# Patient Record
Sex: Female | Born: 1960 | Race: White | Hispanic: No | Marital: Married | Smoking: Former smoker
Health system: Southern US, Community
[De-identification: ages and names within clinical notes are randomized; demographics above are authoritative.]

## PROBLEM LIST (undated history)

## (undated) DIAGNOSIS — I10 Essential (primary) hypertension: Secondary | ICD-10-CM

## (undated) DIAGNOSIS — E079 Disorder of thyroid, unspecified: Secondary | ICD-10-CM

## (undated) DIAGNOSIS — E781 Pure hyperglyceridemia: Secondary | ICD-10-CM

## (undated) DIAGNOSIS — F3281 Premenstrual dysphoric disorder: Secondary | ICD-10-CM

## (undated) HISTORY — DX: Disorder of thyroid, unspecified: E07.9

## (undated) HISTORY — DX: Pure hyperglyceridemia: E78.1

## (undated) HISTORY — DX: Essential (primary) hypertension: I10

## (undated) HISTORY — DX: Premenstrual dysphoric disorder: F32.81

## (undated) HISTORY — PX: TONSILLECTOMY AND ADENOIDECTOMY: SUR1326

---

## 2005-05-11 ENCOUNTER — Ambulatory Visit: Payer: Self-pay | Admitting: Obstetrics and Gynecology

## 2008-12-09 ENCOUNTER — Ambulatory Visit: Payer: Self-pay | Admitting: Internal Medicine

## 2009-09-19 ENCOUNTER — Ambulatory Visit: Payer: Self-pay | Admitting: Nurse Practitioner

## 2010-09-22 ENCOUNTER — Ambulatory Visit: Payer: Self-pay

## 2012-02-25 ENCOUNTER — Ambulatory Visit: Payer: Self-pay | Admitting: Unknown Physician Specialty

## 2012-12-01 ENCOUNTER — Ambulatory Visit: Payer: Self-pay | Admitting: Family Medicine

## 2013-11-17 LAB — HM PAP SMEAR: HM Pap smear: NEGATIVE

## 2015-03-05 ENCOUNTER — Ambulatory Visit (INDEPENDENT_AMBULATORY_CARE_PROVIDER_SITE_OTHER): Payer: BLUE CROSS/BLUE SHIELD | Admitting: Family Medicine

## 2015-03-05 ENCOUNTER — Other Ambulatory Visit: Payer: Self-pay

## 2015-03-05 ENCOUNTER — Encounter: Payer: Self-pay | Admitting: Family Medicine

## 2015-03-05 VITALS — BP 123/76 | HR 88 | Temp 98.9°F | Ht 66.1 in | Wt 216.0 lb

## 2015-03-05 DIAGNOSIS — F3281 Premenstrual dysphoric disorder: Secondary | ICD-10-CM | POA: Diagnosis not present

## 2015-03-05 DIAGNOSIS — E079 Disorder of thyroid, unspecified: Secondary | ICD-10-CM | POA: Diagnosis not present

## 2015-03-05 DIAGNOSIS — I10 Essential (primary) hypertension: Secondary | ICD-10-CM | POA: Diagnosis not present

## 2015-03-05 DIAGNOSIS — E781 Pure hyperglyceridemia: Secondary | ICD-10-CM

## 2015-03-05 NOTE — Assessment & Plan Note (Signed)
Under good control today. Continue current regimen. Continue to monitor.  

## 2015-03-05 NOTE — Progress Notes (Signed)
BP 123/76 mmHg  Pulse 88  Temp(Src) 98.9 F (37.2 C)  Ht 5' 6.1" (1.679 m)  Wt 216 lb (97.977 kg)  BMI 34.76 kg/m2  SpO2 98%  LMP 02/12/2015 (Approximate)   Subjective:    Patient ID: Anna Gallagher, female    DOB: 1960-08-22, 55 y.o.   MRN: [id number]  HPI: Anna Gallagher is a 55 y.o. female  Chief Complaint  Patient presents with  . Establish Care   HYPOTHYROIDISM Thyroid control status:controlled Satisfied with current treatment? yes Medication side effects: no Medication compliance: excellent compliance Recent dose adjustment:no Fatigue: yes Cold intolerance: no Heat intolerance: no Weight gain: no Weight loss: no Constipation: no Diarrhea/loose stools: no Palpitations: yes Lower extremity edema: no Anxiety/depressed mood: no   PMDD- getting better as she is getting older, doing well off medicine  HYPERTENSION / HYPERLIPIDEMIA Satisfied with current treatment? yes Duration of hypertension: chronic BP monitoring frequency: not checking BP medication side effects: no Duration of hyperlipidemia: chronic Cholesterol medication side effects: no - only on fish oil  Cholesterol supplements: fish oil Medication compliance: fair compliance Aspirin: no Recent stressors: no Recurrent headaches: no Visual changes: no Palpitations: no Dyspnea: no Chest pain: no Lower extremity edema: no Dizzy/lightheaded: no  Relevant past medical, surgical, family and social history reviewed and updated as indicated. Interim medical history since our last visit reviewed. Allergies and medications reviewed and updated.  Review of Systems  Constitutional: Negative.   Respiratory: Negative.   Cardiovascular: Negative.   Gastrointestinal: Negative.   Endocrine: Negative.   Psychiatric/Behavioral: Negative.     Per HPI unless specifically indicated above     Objective:    BP 123/76 mmHg  Pulse 88  Temp(Src) 98.9 F (37.2 C)  Ht 5' 6.1" (1.679 m)  Wt 216 lb  (97.977 kg)  BMI 34.76 kg/m2  SpO2 98%  LMP 02/12/2015 (Approximate)  Wt Readings from Last 3 Encounters:  03/05/15 216 lb (97.977 kg)    Physical Exam  Constitutional: She is oriented to person, place, and time. She appears well-developed and well-nourished. No distress.  HENT:  Head: Normocephalic and atraumatic.  Right Ear: Hearing and external ear normal.  Left Ear: Hearing and external ear normal.  Nose: Nose normal.  Mouth/Throat: Oropharynx is clear and moist. No oropharyngeal exudate.  Eyes: Conjunctivae, EOM and lids are normal. Pupils are equal, round, and reactive to light. Right eye exhibits no discharge. Left eye exhibits no discharge. No scleral icterus.  Cardiovascular: Normal rate, regular rhythm, normal heart sounds and intact distal pulses.  Exam reveals no gallop and no friction rub.   No murmur heard. Pulmonary/Chest: Effort normal and breath sounds normal. No respiratory distress. She has no wheezes. She has no rales. She exhibits no tenderness.  Musculoskeletal: Normal range of motion.  Neurological: She is alert and oriented to person, place, and time.  Skin: Skin is warm, dry and intact. No rash noted. No erythema. No pallor.  Psychiatric: She has a normal mood and affect. Her speech is normal and behavior is normal. Judgment and thought content normal. Cognition and memory are normal.  Nursing note and vitals reviewed.   No results found for this or any previous visit.    Assessment & Plan:   Problem List Items Addressed This Visit      Cardiovascular and Mediastinum   Hypertension - Primary    Under good control today. Continue current regimen. Continue to monitor.       Relevant Medications   hydrochlorothiazide (  HYDRODIURIL) 25 MG tablet   Other Relevant Orders   Basic metabolic panel     Endocrine   Thyroid disease    Under good control today. Continue current regimen. Continue to monitor.       Relevant Medications   levothyroxine  (SYNTHROID, LEVOTHROID) 175 MCG tablet     Other   Hypertriglyceridemia    Will recheck in 6 months. Continue to work on diet and exercise. Consider fenofibrate if continues to be so high.       Relevant Medications   hydrochlorothiazide (HYDRODIURIL) 25 MG tablet   PMDD (premenstrual dysphoric disorder)    Under fail control. Continue current regimen. Continue to monitor. .          Follow up plan: Return in about 6 months (around 09/02/2015) for Cholesterol and BP.

## 2015-03-05 NOTE — Assessment & Plan Note (Signed)
Under fail control. Continue current regimen. Continue to monitor. Marland Kitchen

## 2015-03-05 NOTE — Assessment & Plan Note (Signed)
Will recheck in 6 months. Continue to work on diet and exercise. Consider fenofibrate if continues to be so high.

## 2015-03-06 ENCOUNTER — Encounter: Payer: Self-pay | Admitting: Family Medicine

## 2015-03-06 LAB — BASIC METABOLIC PANEL
BUN / CREAT RATIO: 30 — AB (ref 9–23)
BUN: 19 mg/dL (ref 6–24)
CHLORIDE: 98 mmol/L (ref 96–106)
CO2: 25 mmol/L (ref 18–29)
Calcium: 9.4 mg/dL (ref 8.7–10.2)
Creatinine, Ser: 0.64 mg/dL (ref 0.57–1.00)
GFR calc non Af Amer: 102 mL/min/{1.73_m2} (ref >59)
GFR, EST AFRICAN AMERICAN: 118 mL/min/{1.73_m2} (ref >59)
GLUCOSE: 83 mg/dL (ref 65–99)
POTASSIUM: 3.6 mmol/L (ref 3.5–5.2)
SODIUM: 138 mmol/L (ref 134–144)

## 2015-09-03 ENCOUNTER — Ambulatory Visit: Payer: BLUE CROSS/BLUE SHIELD | Admitting: Family Medicine

## 2015-12-31 ENCOUNTER — Ambulatory Visit (INDEPENDENT_AMBULATORY_CARE_PROVIDER_SITE_OTHER): Payer: BLUE CROSS/BLUE SHIELD | Admitting: Family Medicine

## 2015-12-31 ENCOUNTER — Other Ambulatory Visit: Payer: Self-pay | Admitting: Family Medicine

## 2015-12-31 ENCOUNTER — Encounter: Payer: Self-pay | Admitting: Family Medicine

## 2015-12-31 VITALS — BP 137/86 | HR 92 | Temp 98.8°F | Ht 65.4 in | Wt 215.3 lb

## 2015-12-31 DIAGNOSIS — Z0000 Encounter for general adult medical examination without abnormal findings: Secondary | ICD-10-CM

## 2015-12-31 DIAGNOSIS — E781 Pure hyperglyceridemia: Secondary | ICD-10-CM

## 2015-12-31 DIAGNOSIS — E079 Disorder of thyroid, unspecified: Secondary | ICD-10-CM

## 2015-12-31 DIAGNOSIS — F3281 Premenstrual dysphoric disorder: Secondary | ICD-10-CM | POA: Diagnosis not present

## 2015-12-31 DIAGNOSIS — I10 Essential (primary) hypertension: Secondary | ICD-10-CM | POA: Diagnosis not present

## 2015-12-31 LAB — UA/M W/RFLX CULTURE, ROUTINE
BILIRUBIN UA: NEGATIVE
GLUCOSE, UA: NEGATIVE
Ketones, UA: NEGATIVE
LEUKOCYTES UA: NEGATIVE
Nitrite, UA: NEGATIVE
PH UA: 8.5 — AB (ref 5.0–7.5)
PROTEIN UA: NEGATIVE
RBC, UA: NEGATIVE
Specific Gravity, UA: 1.015 (ref 1.005–1.030)
Urobilinogen, Ur: 1 mg/dL (ref 0.2–1.0)

## 2015-12-31 LAB — MICROALBUMIN, URINE WAIVED
Creatinine, Urine Waived: 50 mg/dL (ref 10–300)
MICROALB, UR WAIVED: 10 mg/L (ref 0–19)
Microalb/Creat Ratio: <30 mg/g (ref <30)

## 2015-12-31 MED ORDER — HYDROCHLOROTHIAZIDE 25 MG PO TABS: 25.0000 mg | ORAL_TABLET | Freq: Every day | ORAL | 3 refills | Status: DC

## 2015-12-31 NOTE — Assessment & Plan Note (Signed)
Under good control. Continue current regimen. Continue to monitor. Call with any concerns. 

## 2015-12-31 NOTE — Progress Notes (Signed)
BP 137/86 (BP Location: Left Arm, Patient Position: Sitting, Cuff Size: Normal)   Pulse 92   Temp 98.8 F (37.1 C)   Ht 5' 5.4" (1.661 m)   Wt 215 lb 4.8 oz (97.7 kg)   LMP 12/31/2015 (Exact Date)   SpO2 96%   BMI 35.39 kg/m    Subjective:    Patient ID: Anna Gallagher, female    DOB: [birth year], 55 y.o.   MRN: [id number]  HPI: Anna Sierrasheresa M Pickney is a [age] y.o. female presenting on 12/31/2015 for comprehensive medical examination. Current medical complaints include:  HYPERTENSION / HYPERLIPIDEMIA Satisfied with current treatment? yes Duration of hypertension: chronic BP monitoring frequency: not checking BP medication side effects: no Past BP meds: HCTZ Duration of hyperlipidemia: chronic Cholesterol medication side effects: Not on anything Cholesterol supplements: fish oil Past cholesterol medications: none Medication compliance: excellent compliance Aspirin: no Recent stressors: yes Recurrent headaches: no Visual changes: no Palpitations: no Dyspnea: no Chest pain: no Lower extremity edema: no Dizzy/lightheaded: no  HYPOTHYROIDISM Thyroid control status:controlled Satisfied with current treatment? yes Medication side effects: yes Medication compliance: excellent compliance Recent dose adjustment:no Fatigue: yes Cold intolerance: no Heat intolerance: no Weight gain: no Weight loss: no Constipation: no Diarrhea/loose stools: no Palpitations: no Lower extremity edema: no Anxiety/depressed mood: yes  She currently lives with: husband and sons Menopausal Symptoms: yes  Depression Screen done today and results listed below:  [facility]  Decreased Interest 0  Down, Depressed, Hopeless 0  PHQ - 2 Score 0  Altered sleeping 0  Tired, decreased energy 1  Change in appetite 1  Feeling bad or failure about yourself  0  Trouble concentrating 0  Moving slowly or fidgety/restless 0  Suicidal thoughts 0  PHQ-9 Score 2   Past  Medical History:  Past Medical History:  Diagnosis Date  . Hypertension   . Hypertriglyceridemia   . PMDD (premenstrual dysphoric disorder)   . Thyroid disease     Surgical History:  Past Surgical History:  Procedure Laterality Date  . TONSILLECTOMY AND ADENOIDECTOMY      Medications:  Current Outpatient Prescriptions on File Prior to Visit  Medication Sig  . levothyroxine (SYNTHROID, LEVOTHROID) 175 MCG tablet Take 175 mcg by mouth daily.  . Loratadine 10 MG CAPS Take by mouth daily as needed.  . Omega-3 Fatty Acids (FISH OIL CONCENTRATE) 300 MG CAPS Take 900 mg by mouth daily.  Satira Sark. [place] Johns Wort (V-R ST JOHNS WORT) 300 MG TABS Take 375 mg by mouth daily.   No current facility-administered medications on file prior to visit.     Allergies:  No Known Allergies  Social History:  Social History   Social History  . Marital status: Married    Spouse name: N/A  . Number of children: N/A  . Years of education: N/A   Occupational History  . Not on file.   Social History Main Topics  . Smoking status: Former Smoker    Quit date: 03/04/1985  . Smokeless tobacco: Never Used  . Alcohol use No  . Drug use: No  . Sexual activity: Not on file   Other Topics Concern  . Not on file   Social History Narrative  . No narrative on file   History  Smoking Status  . Former Smoker  . Quit date: 03/04/1985  Smokeless Tobacco  . Never Used   History  Alcohol Use No    Family History:  Family History  Problem Relation Age  of Onset  . Hypertension Father   . Hyperlipidemia Father   . Diabetes Father   . Hypertension Brother     Past medical history, surgical history, medications, allergies, family history and social history reviewed with patient today and changes made to appropriate areas of the chart.   Review of Systems  Constitutional: Negative.   HENT: Negative.   Eyes: Negative.   Respiratory: Negative.   Cardiovascular: Negative.   Gastrointestinal: Negative.    Genitourinary: Negative.   Musculoskeletal: Negative.   Skin: Negative.   Neurological: Negative.   Endo/Heme/Allergies: Negative.   Psychiatric/Behavioral: Negative.     All other ROS negative except what is listed above and in the HPI.      Objective:    BP 137/86 (BP Location: Left Arm, Patient Position: Sitting, Cuff Size: Normal)   Pulse 92   Temp 98.8 F (37.1 C)   Ht 5' 5.4" (1.661 m)   Wt 215 lb 4.8 oz (97.7 kg)   LMP 12/31/2015 (Exact Date)   SpO2 96%   BMI 35.39 kg/m   Wt Readings from Last 3 Encounters:  12/31/15 215 lb 4.8 oz (97.7 kg)  03/05/15 216 lb (98 kg)    Physical Exam  Constitutional: She is oriented to person, place, and time. She appears well-developed and well-nourished. No distress.  HENT:  Head: Normocephalic and atraumatic.  Right Ear: Hearing, tympanic membrane, external ear and ear canal normal.  Left Ear: Hearing, tympanic membrane, external ear and ear canal normal.  Nose: Nose normal.  Mouth/Throat: Uvula is midline, oropharynx is clear and moist and mucous membranes are normal. No oropharyngeal exudate.  Eyes: Conjunctivae, EOM and lids are normal. Pupils are equal, round, and reactive to light. Right eye exhibits no discharge. Left eye exhibits no discharge. No scleral icterus.  Neck: Normal range of motion. Neck supple. No JVD present. No tracheal deviation present. No thyromegaly present.  Cardiovascular: Normal rate, regular rhythm, normal heart sounds and intact distal pulses.  Exam reveals no gallop and no friction rub.   No murmur heard. Pulmonary/Chest: Effort normal and breath sounds normal. No stridor. No respiratory distress. She has no wheezes. She has no rales. She exhibits no tenderness. Right breast exhibits no inverted nipple, no mass, no nipple discharge, no skin change and no tenderness. Left breast exhibits no inverted nipple, no mass, no nipple discharge, no skin change and no tenderness. Breasts are symmetrical.    Abdominal: Soft. Bowel sounds are normal. She exhibits no distension and no mass. There is no tenderness. There is no rebound and no guarding.  Genitourinary:  Genitourinary Comments: GYN exam deferred with shared decision making  Musculoskeletal: Normal range of motion. She exhibits no edema, tenderness or deformity.  Lymphadenopathy:    She has no cervical adenopathy.  Neurological: She is alert and oriented to person, place, and time. She has normal reflexes. She displays normal reflexes. No cranial nerve deficit. She exhibits normal muscle tone. Coordination normal.  Skin: Skin is warm, dry and intact. No rash noted. She is not diaphoretic. No erythema. No pallor.  Psychiatric: She has a normal mood and affect. Her speech is normal and behavior is normal. Judgment and thought content normal. Cognition and memory are normal.  Nursing note and vitals reviewed.   Results for orders placed or performed in visit on 03/05/15  Basic metabolic panel  Result Value Ref Range   Glucose 83 65 - 99 mg/dL   BUN 19 6 - 24 mg/dL   Creatinine,  Ser 0.64 0.57 - 1.00 mg/dL   GFR calc non Af Amer 102 >59 mL/min/1.73   GFR calc Af Amer 118 >59 mL/min/1.73   BUN/Creatinine Ratio 30 (H) 9 - 23   Sodium 138 134 - 144 mmol/L   Potassium 3.6 3.5 - 5.2 mmol/L   Chloride 98 96 - 106 mmol/L   CO2 25 18 - 29 mmol/L   Calcium 9.4 8.7 - 10.2 mg/dL      Assessment & Plan:   Problem List Items Addressed This Visit      Cardiovascular and Mediastinum   Hypertension    Under good control. Continue current regimen. Continue to monitor. Call with any concerns.       Relevant Medications   hydrochlorothiazide (HYDRODIURIL) 25 MG tablet     Endocrine   Thyroid disease    Rechecking levels today. Will adjust dose as needed. Await results.         Other   Hypertriglyceridemia    Rechecking levels today. Await results. Call with any concerns.       Relevant Medications   hydrochlorothiazide  (HYDRODIURIL) 25 MG tablet   PMDD (premenstrual dysphoric disorder)    Stable. Bothers her a lot before period, but stable. Call with any concerns.        Other Visit Diagnoses    Routine general medical examination at a health care facility    -  Primary   Vaccines up to date. Screening labs checked today. Pap up to date. Mammogram ordered. Colonoscopy up to date. Continue diet and exercise. Recheck 1 year.       Follow up plan: Return in about 6 months (around 06/29/2016) for Follow up BP/Chol.   LABORATORY TESTING:  - Pap smear: up to date  IMMUNIZATIONS:   - Tdap: Tetanus vaccination status reviewed: last tetanus booster within 10 years. - Influenza: Refused - Pneumovax: Not applicable - Prevnar: Not applicable - Zostavax vaccine: Not applicable  SCREENING: -Mammogram: Up to date- would like to wait until next year for next mammogram  - Colonoscopy: Up to date  - Bone Density: Not applicable   PATIENT COUNSELING:   Advised to take 1 mg of folate supplement per day if capable of pregnancy.   Sexuality: Discussed sexually transmitted diseases, partner selection, use of condoms, avoidance of unintended pregnancy  and contraceptive alternatives.   Advised to avoid cigarette smoking.  I discussed with the patient that most people either abstain from alcohol or drink within safe limits (<=14/week and <=4 drinks/occasion for males, <=7/weeks and <= 3 drinks/occasion for females) and that the risk for alcohol disorders and other health effects rises proportionally with the number of drinks per week and how often a drinker exceeds daily limits.  Discussed cessation/primary prevention of drug use and availability of treatment for abuse.   Diet: Encouraged to adjust caloric intake to maintain  or achieve ideal body weight, to reduce intake of dietary saturated fat and total fat, to limit sodium intake by avoiding high sodium foods and not adding table salt, and to maintain adequate  dietary potassium and calcium preferably from fresh fruits, vegetables, and low-fat dairy products.    stressed the importance of regular exercise  Injury prevention: Discussed safety belts, safety helmets, smoke detector, smoking near bedding or upholstery.   Dental health: Discussed importance of regular tooth brushing, flossing, and dental visits.    NEXT PREVENTATIVE PHYSICAL DUE IN 1 YEAR. Return in about 6 months (around 06/29/2016) for Follow up BP/Chol.

## 2015-12-31 NOTE — Patient Instructions (Addendum)

## 2015-12-31 NOTE — Assessment & Plan Note (Signed)
Rechecking levels today. Will adjust dose as needed. Await results.  

## 2015-12-31 NOTE — Assessment & Plan Note (Addendum)
Stable. Bothers her a lot before period, but stable. Call with any concerns.

## 2015-12-31 NOTE — Assessment & Plan Note (Signed)
Rechecking levels today. Await results. Call with any concerns.  

## 2016-01-01 LAB — COMPREHENSIVE METABOLIC PANEL
ALT: 16 IU/L (ref 0–32)
AST: 16 IU/L (ref 0–40)
Albumin/Globulin Ratio: 1.7 (ref 1.2–2.2)
Albumin: 4 g/dL (ref 3.5–5.5)
Alkaline Phosphatase: 65 IU/L (ref 39–117)
BUN/Creatinine Ratio: 13 (ref 9–23)
BUN: 11 mg/dL (ref 6–24)
Bilirubin Total: 0.4 mg/dL (ref 0.0–1.2)
CALCIUM: 9.3 mg/dL (ref 8.7–10.2)
CO2: 26 mmol/L (ref 18–29)
CREATININE: 0.86 mg/dL (ref 0.57–1.00)
Chloride: 100 mmol/L (ref 96–106)
GFR calc Af Amer: 89 mL/min/{1.73_m2} (ref >59)
GFR, EST NON AFRICAN AMERICAN: 77 mL/min/{1.73_m2} (ref >59)
GLUCOSE: 84 mg/dL (ref 65–99)
Globulin, Total: 2.4 g/dL (ref 1.5–4.5)
POTASSIUM: 3.8 mmol/L (ref 3.5–5.2)
Sodium: 142 mmol/L (ref 134–144)
Total Protein: 6.4 g/dL (ref 6.0–8.5)

## 2016-01-01 LAB — LIPID PANEL W/O CHOL/HDL RATIO
Cholesterol, Total: 185 mg/dL (ref 100–199)
HDL: 45 mg/dL (ref >39)
LDL CALC: 98 mg/dL (ref 0–99)
TRIGLYCERIDES: 211 mg/dL — AB (ref 0–149)
VLDL CHOLESTEROL CAL: 42 mg/dL — AB (ref 5–40)

## 2016-01-01 LAB — TSH: TSH: 0.146 u[IU]/mL — ABNORMAL LOW (ref 0.450–4.500)

## 2016-01-01 LAB — CBC WITH DIFFERENTIAL/PLATELET
Basophils Absolute: 0 10*3/uL (ref 0.0–0.2)
Basos: 0 %
EOS (ABSOLUTE): 0.1 10*3/uL (ref 0.0–0.4)
Eos: 2 %
Hematocrit: 37.5 % (ref 34.0–46.6)
Hemoglobin: 12.6 g/dL (ref 11.1–15.9)
IMMATURE GRANS (ABS): 0 10*3/uL (ref 0.0–0.1)
IMMATURE GRANULOCYTES: 0 %
LYMPHS: 28 %
Lymphocytes Absolute: 1.7 10*3/uL (ref 0.7–3.1)
MCH: 28.5 pg (ref 26.6–33.0)
MCHC: 33.6 g/dL (ref 31.5–35.7)
MCV: 85 fL (ref 79–97)
MONOS ABS: 0.5 10*3/uL (ref 0.1–0.9)
Monocytes: 8 %
NEUTROS PCT: 62 %
Neutrophils Absolute: 3.6 10*3/uL (ref 1.4–7.0)
PLATELETS: 301 10*3/uL (ref 150–379)
RBC: 4.42 x10E6/uL (ref 3.77–5.28)
RDW: 15.2 % (ref 12.3–15.4)
WBC: 5.8 10*3/uL (ref 3.4–10.8)

## 2016-01-03 ENCOUNTER — Telehealth: Payer: Self-pay | Admitting: Family Medicine

## 2016-01-03 DIAGNOSIS — E079 Disorder of thyroid, unspecified: Secondary | ICD-10-CM

## 2016-01-03 MED ORDER — LEVOTHYROXINE SODIUM 150 MCG PO TABS: 150.0000 ug | ORAL_TABLET | Freq: Every day | ORAL | 1 refills | Status: DC

## 2016-01-03 NOTE — Telephone Encounter (Signed)
Su Monksalled [place] Thyroid over treated. The rest of her labs look great. I'm going to decrease her to 150mcg and I'd like her to come back in just for a lab check in 6 weeks. OK to give her this message if she calls back. Mychart message sent to her as well

## 2016-01-06 NOTE — Telephone Encounter (Signed)
Called patient. She was aware.

## 2016-02-25 ENCOUNTER — Other Ambulatory Visit: Payer: BLUE CROSS/BLUE SHIELD

## 2016-02-25 DIAGNOSIS — E079 Disorder of thyroid, unspecified: Secondary | ICD-10-CM

## 2016-02-26 LAB — TSH: TSH: 0.93 u[IU]/mL (ref 0.450–4.500)

## 2016-02-28 ENCOUNTER — Other Ambulatory Visit: Payer: Self-pay | Admitting: Family Medicine

## 2016-02-28 MED ORDER — LEVOTHYROXINE SODIUM 150 MCG PO TABS: 150.0000 ug | ORAL_TABLET | Freq: Every day | ORAL | 11 refills | Status: DC

## 2016-04-07 ENCOUNTER — Other Ambulatory Visit: Payer: Self-pay | Admitting: Family Medicine

## 2016-04-07 DIAGNOSIS — Z1231 Encounter for screening mammogram for malignant neoplasm of breast: Secondary | ICD-10-CM

## 2016-04-20 ENCOUNTER — Ambulatory Visit: Admission: RE | Admit: 2016-04-20 | Payer: Self-pay | Source: Ambulatory Visit

## 2016-04-27 ENCOUNTER — Ambulatory Visit
Admission: RE | Admit: 2016-04-27 | Discharge: 2016-04-27 | Disposition: A | Discharge status: Home / Self Care | Payer: BLUE CROSS/BLUE SHIELD | Source: Ambulatory Visit | Attending: Family Medicine | Admitting: Family Medicine

## 2016-04-27 ENCOUNTER — Other Ambulatory Visit: Payer: Self-pay | Admitting: Family Medicine

## 2016-04-27 DIAGNOSIS — Z1231 Encounter for screening mammogram for malignant neoplasm of breast: Secondary | ICD-10-CM | POA: Diagnosis not present

## 2016-06-29 ENCOUNTER — Ambulatory Visit: Payer: BLUE CROSS/BLUE SHIELD | Admitting: Family Medicine

## 2017-01-08 LAB — BASIC METABOLIC PANEL
BUN: 16 (ref 4–21)
CREATININE: 0.8 (ref 0.5–1.1)
Glucose: 102
Potassium: 3.3 — AB (ref 3.4–5.3)
Sodium: 144 (ref 137–147)

## 2017-01-08 LAB — CBC AND DIFFERENTIAL
HCT: 42 (ref 36–46)
Hemoglobin: 14.2 (ref 12.0–16.0)
PLATELETS: 272 (ref 150–399)
WBC: 6.3

## 2017-01-08 LAB — LIPID PANEL
Cholesterol: 213 — AB (ref 0–200)
HDL: 43 (ref 35–70)
LDL Cholesterol: 128
Triglycerides: 211 — AB (ref 40–160)

## 2017-01-08 LAB — HEPATIC FUNCTION PANEL
ALK PHOS: 60 (ref 25–125)
ALT: 34 (ref 7–35)
AST: 21 (ref 13–35)

## 2017-01-08 LAB — TSH: TSH: 0.16 — AB (ref 0.41–5.90)

## 2017-01-14 ENCOUNTER — Other Ambulatory Visit: Payer: Self-pay | Admitting: Family Medicine

## 2017-01-21 ENCOUNTER — Ambulatory Visit (INDEPENDENT_AMBULATORY_CARE_PROVIDER_SITE_OTHER): Payer: Self-pay | Admitting: Family Medicine

## 2017-01-21 ENCOUNTER — Encounter: Payer: Self-pay | Admitting: Family Medicine

## 2017-01-21 VITALS — BP 118/74 | HR 91 | Temp 99.1°F | Ht 65.5 in | Wt 189.2 lb

## 2017-01-21 DIAGNOSIS — Z0000 Encounter for general adult medical examination without abnormal findings: Secondary | ICD-10-CM

## 2017-01-21 DIAGNOSIS — E079 Disorder of thyroid, unspecified: Secondary | ICD-10-CM

## 2017-01-21 DIAGNOSIS — I10 Essential (primary) hypertension: Secondary | ICD-10-CM

## 2017-01-21 DIAGNOSIS — E781 Pure hyperglyceridemia: Secondary | ICD-10-CM

## 2017-01-21 DIAGNOSIS — Z1239 Encounter for other screening for malignant neoplasm of breast: Secondary | ICD-10-CM

## 2017-01-21 MED ORDER — LEVOTHYROXINE SODIUM 125 MCG PO TABS: 125.0000 ug | ORAL_TABLET | Freq: Every day | ORAL | 3 refills | Status: DC

## 2017-01-21 MED ORDER — HYDROCHLOROTHIAZIDE 25 MG PO TABS: 25.0000 mg | ORAL_TABLET | Freq: Every day | ORAL | 3 refills | Status: DC

## 2017-01-21 NOTE — Progress Notes (Signed)
BP 118/74 (BP Location: Left Arm, Cuff Size: Normal)   Pulse 91   Temp 99.1 F (37.3 C)   Ht 5' 5.5" (1.664 m)   Wt 189 lb 4 oz (85.8 kg)   SpO2 96%   BMI 31.01 kg/m    Subjective:    Patient ID: Anna Gallagher, female    DOB: Dec 25, 1960, 56 y.o.   MRN: [id number]  HPI: Anna Gallagher is a 56 y.o. female presenting on 01/21/2017 for comprehensive medical examination. Current medical complaints include:  HYPERTENSION Hypertension status: controlled  Satisfied with current treatment? yes Duration of hypertension: chronic BP monitoring frequency:  not checking BP medication side effects:  no Medication compliance: excellent compliance Previous BP meds: hctz Aspirin: no Recurrent headaches: no Visual changes: no Palpitations: no Dyspnea: no Chest pain: no Lower extremity edema: no Dizzy/lightheaded: no  HYPOTHYROIDISM Thyroid control status:uncontrolled Satisfied with current treatment? no Medication side effects: no Medication compliance: excellent compliance Recent dose adjustment:no Fatigue: no Cold intolerance: no Heat intolerance: no Weight gain: no Weight loss: no Constipation: no Diarrhea/loose stools: no Palpitations: no Lower extremity edema: no Anxiety/depressed mood: no  She currently lives with: husband and sons Menopausal Symptoms: no  Depression Screen done today and results listed below:  Depression screen [facility] 2/9 01/21/2017 12/31/2015  Decreased Interest 0 0  Down, Depressed, Hopeless 0 0  PHQ - 2 Score 0 0  Altered sleeping 0 0  Tired, decreased energy 0 1  Change in appetite 0 1  Feeling bad or failure about yourself  0 0  Trouble concentrating 0 0  Moving slowly or fidgety/restless 0 0  Suicidal thoughts 0 0  PHQ-9 Score 0 2    Past Medical History:  Past Medical History:  Diagnosis Date  . Hypertension   . Hypertriglyceridemia   . PMDD (premenstrual dysphoric disorder)   . Thyroid disease     Surgical History:    Past Surgical History:  Procedure Laterality Date  . TONSILLECTOMY AND ADENOIDECTOMY      Medications:  Current Outpatient Medications on File Prior to Visit  Medication Sig  . Loratadine 10 MG CAPS Take by mouth daily as needed.  . Omega-3 Fatty Acids (FISH OIL CONCENTRATE) 300 MG CAPS Take 900 mg by mouth daily.  Satira Sark Johns Wort (V-R ST JOHNS WORT) 300 MG TABS Take 375 mg by mouth daily.   No current facility-administered medications on file prior to visit.     Allergies:  No Known Allergies  Social History:  Social History   Socioeconomic History  . Marital status: Married    Spouse name: Not on file  . Number of children: Not on file  . Years of education: Not on file  . Highest education level: Not on file  Social Needs  . Financial resource strain: Not on file  . Food insecurity - worry: Not on file  . Food insecurity - inability: Not on file  . Transportation needs - medical: Not on file  . Transportation needs - non-medical: Not on file  Occupational History  . Not on file  Tobacco Use  . Smoking status: Former Smoker    Last attempt to quit: 03/04/1985    Years since quitting: 31.9  . Smokeless tobacco: Never Used  Substance and Sexual Activity  . Alcohol use: No  . Drug use: No  . Sexual activity: Not on file  Other Topics Concern  . Not on file  Social History Narrative  . Not on file  Social History   Tobacco Use  Smoking Status Former Smoker  . Last attempt to quit: 03/04/1985  . Years since quitting: 31.9  Smokeless Tobacco Never Used   Social History   Substance and Sexual Activity  Alcohol Use No    Family History:  Family History  Problem Relation Age of Onset  . Hypertension Father   . Hyperlipidemia Father   . Diabetes Father   . Hypertension Brother   . Breast cancer Paternal Aunt [id number]    Past medical history, surgical history, medications, allergies, family history and social history reviewed with patient today and changes  made to appropriate areas of the chart.   Review of Systems  Constitutional: Negative.   HENT: Negative.   Eyes: Negative.   Respiratory: Negative.   Cardiovascular: Negative.   Gastrointestinal: Negative.   Genitourinary: Negative.   Musculoskeletal: Negative.   Skin: Negative.   Neurological: Negative.   Endo/Heme/Allergies: Negative.   Psychiatric/Behavioral: Negative.    All other ROS negative except what is listed above and in the HPI.      Objective:    BP 118/74 (BP Location: Left Arm, Cuff Size: Normal)   Pulse 91   Temp 99.1 F (37.3 C)   Ht 5' 5.5" (1.664 m)   Wt 189 lb 4 oz (85.8 kg)   SpO2 96%   BMI 31.01 kg/m   Wt Readings from Last 3 Encounters:  01/21/17 189 lb 4 oz (85.8 kg)  12/31/15 215 lb 4.8 oz (97.7 kg)  03/05/15 216 lb (98 kg)    Physical Exam  Constitutional: She is oriented to person, place, and time. She appears well-developed and well-nourished. No distress.  HENT:  Head: Normocephalic and atraumatic.  Right Ear: Hearing, tympanic membrane, external ear and ear canal normal.  Left Ear: Hearing, tympanic membrane, external ear and ear canal normal.  Nose: Nose normal.  Mouth/Throat: Uvula is midline, oropharynx is clear and moist and mucous membranes are normal. No oropharyngeal exudate.  Eyes: Conjunctivae, EOM and lids are normal. Pupils are equal, round, and reactive to light. Right eye exhibits no discharge. Left eye exhibits no discharge. No scleral icterus.  Neck: Normal range of motion. Neck supple. No JVD present. No tracheal deviation present. No thyromegaly present.  Cardiovascular: Normal rate, regular rhythm, normal heart sounds and intact distal pulses. Exam reveals no gallop and no friction rub.  No murmur heard. Pulmonary/Chest: Effort normal and breath sounds normal. No stridor. No respiratory distress. She has no wheezes. She has no rales. She exhibits no tenderness. Right breast exhibits no inverted nipple, no mass, no nipple  discharge, no skin change and no tenderness. Left breast exhibits no inverted nipple, no mass, no nipple discharge, no skin change and no tenderness. Breasts are symmetrical.  Abdominal: Soft. Bowel sounds are normal. She exhibits no distension and no mass. There is no tenderness. There is no rebound and no guarding.  Genitourinary:  Genitourinary Comments: Pelvic exam deferred with shared decision making  Musculoskeletal: Normal range of motion. She exhibits no edema, tenderness or deformity.  Lymphadenopathy:    She has no cervical adenopathy.  Neurological: She is alert and oriented to person, place, and time. She has normal reflexes. She displays normal reflexes. No cranial nerve deficit. She exhibits normal muscle tone. Coordination normal.  Skin: Skin is warm, dry and intact. No rash noted. She is not diaphoretic. No erythema. No pallor.  Psychiatric: She has a normal mood and affect. Her speech is normal and behavior is  normal. Judgment and thought content normal. Cognition and memory are normal.  Nursing note and vitals reviewed.   Results for orders placed or performed in visit on 01/21/17  HM PAP SMEAR  Result Value Ref Range   HM Pap smear Pap negative HPV negative   CBC and differential  Result Value Ref Range   Hemoglobin 14.2 12.0 - 16.0   HCT 42 36 - 46   Platelets 272 150 - 399   WBC 6.3   Basic metabolic panel  Result Value Ref Range   Glucose 102    BUN 16 4 - 21   Creatinine 0.8 0.5 - 1.1   Potassium 3.3 (A) 3.4 - 5.3   Sodium 144 137 - 147  Lipid panel  Result Value Ref Range   Triglycerides 211 (A) 40 - 160   Cholesterol 213 (A) 0 - 200   HDL 43 35 - 70   LDL Cholesterol 128   Hepatic function panel  Result Value Ref Range   Alkaline Phosphatase 60 25 - 125   ALT 34 7 - 35   AST 21 13 - 35  TSH  Result Value Ref Range   TSH 0.16 (A) 0.41 - 5.90      Assessment & Plan:   Problem List Items Addressed This Visit      Cardiovascular and Mediastinum     Hypertension    Under good control on recheck. Continue current regimen. Continue to monitor.       Relevant Medications   hydrochlorothiazide (HYDRODIURIL) 25 MG tablet   Other Relevant Orders   Microalbumin, Urine Waived     Endocrine   Thyroid disease    Thyroid over treated. Will drop levothyroxine to and recheck 6 weeks. Call with any concerns.       Relevant Medications   levothyroxine (SYNTHROID, LEVOTHROID) 125 MCG tablet   Other Relevant Orders   Thyroid Panel With TSH     Other   Hypertriglyceridemia    Slightly elevated- doing keto diet. Continue to monitor.      Relevant Medications   hydrochlorothiazide (HYDRODIURIL) 25 MG tablet    Other Visit Diagnoses    Routine general medical examination at a health care facility    -  Primary   Vaccines up to date. Screening labs checked and brought in. Pap up to date. Mammogram ordered today- due in March. Colon up to date. Continue diet and exercise.   Relevant Orders   UA/M w/rflx Culture, Routine       Follow up plan: Return in about 1 year (around 01/21/2018) for Physical.   LABORATORY TESTING:  - Pap smear: up to date  IMMUNIZATIONS:   - Tdap: Tetanus vaccination status reviewed: last tetanus booster within 10 years. - Influenza: Refused - Pneumovax: Not applicable  SCREENING: -Mammogram: Ordered today  - Colonoscopy: Up to date  - Bone Density: Not applicable   PATIENT COUNSELING:   Advised to take 1 mg of folate supplement per day if capable of pregnancy.   Sexuality: Discussed sexually transmitted diseases, partner selection, use of condoms, avoidance of unintended pregnancy  and contraceptive alternatives.   Advised to avoid cigarette smoking.  I discussed with the patient that most people either abstain from alcohol or drink within safe limits (<=14/week and <=4 drinks/occasion for males, <=7/weeks and <= 3 drinks/occasion for females) and that the risk for alcohol disorders and other  health effects rises proportionally with the number of drinks per week and how often  a drinker exceeds daily limits.  Discussed cessation/primary prevention of drug use and availability of treatment for abuse.   Diet: Encouraged to adjust caloric intake to maintain  or achieve ideal body weight, to reduce intake of dietary saturated fat and total fat, to limit sodium intake by avoiding high sodium foods and not adding table salt, and to maintain adequate dietary potassium and calcium preferably from fresh fruits, vegetables, and low-fat dairy products.    stressed the importance of regular exercise  Injury prevention: Discussed safety belts, safety helmets, smoke detector, smoking near bedding or upholstery.   Dental health: Discussed importance of regular tooth brushing, flossing, and dental visits.    NEXT PREVENTATIVE PHYSICAL DUE IN 1 YEAR. Return in about 1 year (around 01/21/2018) for Physical.

## 2017-01-21 NOTE — Assessment & Plan Note (Signed)
Thyroid over treated. Will drop levothyroxine to and recheck 6 weeks. Call with any concerns.

## 2017-01-21 NOTE — Patient Instructions (Addendum)

## 2017-01-21 NOTE — Assessment & Plan Note (Signed)
Under good control on recheck. Continue current regimen. Continue to monitor.  

## 2017-01-21 NOTE — Assessment & Plan Note (Signed)
Slightly elevated- doing keto diet. Continue to monitor.

## 2017-01-21 NOTE — Addendum Note (Signed)
Addended by: Dorcas CarrowJOHNSON, MEGAN P on: 01/21/2017 03:05 PM   Modules accepted: Orders

## 2017-03-23 ENCOUNTER — Encounter: Payer: Self-pay | Admitting: Family Medicine

## 2017-03-23 ENCOUNTER — Other Ambulatory Visit: Payer: Self-pay | Admitting: Family Medicine

## 2017-03-23 MED ORDER — LEVOTHYROXINE SODIUM 125 MCG PO TABS: 125.0000 ug | ORAL_TABLET | Freq: Every day | ORAL | 3 refills | Status: DC

## 2017-07-14 ENCOUNTER — Other Ambulatory Visit: Payer: Self-pay | Admitting: Family Medicine

## 2017-07-14 ENCOUNTER — Encounter: Payer: Self-pay | Admitting: Family Medicine

## 2017-07-14 MED ORDER — LEVOTHYROXINE SODIUM 125 MCG PO TABS: 125.0000 ug | ORAL_TABLET | Freq: Every day | ORAL | 3 refills | Status: AC

## 2017-07-14 MED ORDER — HYDROCHLOROTHIAZIDE 25 MG PO TABS: 25.0000 mg | ORAL_TABLET | Freq: Every day | ORAL | 3 refills | Status: AC

## 2017-07-15 NOTE — Telephone Encounter (Signed)
HCTZ refill Last Refill:07/14/17 # 90 3 RF Last OV: 01/21/17 PCP: Olevia PerchesMegan Johnson DO [facility] [address]

## 2017-09-08 ENCOUNTER — Ambulatory Visit: Payer: Self-pay | Attending: Oncology | Admitting: *Deleted

## 2017-09-08 ENCOUNTER — Ambulatory Visit
Admission: RE | Admit: 2017-09-08 | Discharge: 2017-09-08 | Disposition: A | Discharge status: Home / Self Care | Payer: Self-pay | Source: Ambulatory Visit | Attending: Oncology | Admitting: Oncology

## 2017-09-08 VITALS — BP 143/82 | HR 88 | Temp 96.8°F | Resp 18 | Ht 66.0 in | Wt 209.0 lb

## 2017-09-08 DIAGNOSIS — Z0000 Encounter for general adult medical examination without abnormal findings: Secondary | ICD-10-CM

## 2017-09-08 NOTE — Progress Notes (Signed)
  Subjective:     Patient ID: Larry Sierrasheresa M Lubke, female   DOB: 02/26/1960, 57 y.o.   MRN: [id number]  HPI   Review of Systems     Objective:   Physical Exam  Pulmonary/Chest: Right breast exhibits no inverted nipple, no mass, no nipple discharge, no skin change and no tenderness. Left breast exhibits no inverted nipple, no mass, no nipple discharge, no skin change and no tenderness.         Assessment:     57 [facility] White [facility].  Clinical breast with slight redness noted under bilateral breast.  Encouraged patient to keep skin dry.  Recommended Monkey Butt powder.  Taught self breast awareness.  Last pap on 11/17/13 was negative / negative.  Next pap due in 2020.  Patient has been screened for eligibility.  She does not have any insurance, Medicare or Medicaid.  She also meets financial eligibility.  Hand-out given on the Affordable Care Act. Risk Assessment    Risk Scores      09/08/2017   Last edited by: Lenn SinkWalker, Christine A, RN   5-year risk: 1.7 %   Lifetime risk: 10.9 %             Plan:     Screening mammogram ordered.  Will follow-up per [facility] protocol.

## 2017-09-08 NOTE — Patient Instructions (Signed)
Gave patient hand-out, Women Staying Healthy, Active and Well from [facility], with education on breast health, pap smears, heart and colon health. 

## 2017-09-09 ENCOUNTER — Encounter: Payer: Self-pay | Admitting: *Deleted

## 2017-09-09 NOTE — Progress Notes (Signed)
Letter mailed from [facility] to inform patient of her normal mammogram results.  Patient is to follow-up with annual screening in one year.  HSIS to Christy. 

## 2018-04-25 ENCOUNTER — Telehealth: Payer: Self-pay | Admitting: Family Medicine

## 2018-04-25 ENCOUNTER — Other Ambulatory Visit: Payer: Self-pay | Admitting: Family Medicine

## 2018-04-25 NOTE — Telephone Encounter (Signed)
Copied from CRM [id number]. Topic: Quick Communication - Rx Refill/Question >> Apr 25, 2018  3:41 PM Mickel Baas B, NT wrote: **Patient states that she does not wish to come into the office due to the [place] Virus going around. Would like to know if this could be refilled without her coming in the office?**  Medication: levothyroxine (SYNTHROID, LEVOTHROID) 125 MCG tablet  Has the patient contacted their pharmacy? Yes.   (Agent: If no, request that the patient contact the pharmacy for the refill.) (Agent: If yes, when and what did the pharmacy advise?)  Preferred Pharmacy (with phone number or street name): [id number] - [place], [place] - [address] [place] [address]  Agent: Please be advised that RX refills may take up to 3 business days. We ask that you follow-up with your pharmacy.

## 2018-04-26 NOTE — Telephone Encounter (Signed)
Called and left a message letting patient know that she should have medication at [facility] [place], and if not to give [place] a call and Dr.Johnson will give her a month supply.

## 2018-04-26 NOTE — Telephone Encounter (Signed)
See attached note.   Pt not wanting to come into the office.   Thanks.

## 2018-04-26 NOTE — Telephone Encounter (Signed)
She was given a years supply of her medicine in June. She should not be due. If she is out- She has not been in the office since 01/2017- will give her 1 month supply of her medicine, but she will need an appointment with blood work before she can get more.

## 2019-08-09 ENCOUNTER — Ambulatory Visit: Payer: Self-pay | Attending: Oncology | Admitting: *Deleted

## 2019-08-09 ENCOUNTER — Ambulatory Visit
Admission: RE | Admit: 2019-08-09 | Discharge: 2019-08-09 | Disposition: A | Discharge status: Home / Self Care | Payer: Self-pay | Source: Ambulatory Visit | Attending: Oncology | Admitting: Oncology

## 2019-08-09 ENCOUNTER — Encounter: Payer: Self-pay | Admitting: *Deleted

## 2019-08-09 ENCOUNTER — Other Ambulatory Visit: Payer: Self-pay

## 2019-08-09 VITALS — BP 122/78 | HR 84 | Temp 98.4°F | Ht 67.0 in | Wt 203.0 lb

## 2019-08-09 DIAGNOSIS — Z0000 Encounter for general adult medical examination without abnormal findings: Secondary | ICD-10-CM

## 2019-08-09 NOTE — Progress Notes (Signed)
Subjective:     Patient ID: Anna Gallagher, female   DOB: 08-May-1960, 59 y.o.   MRN: [id number]  HPI   [facility] Medical History Record - 08/09/19 0848      Breast History   Screening cycle Rescreen    CBE Date 09/08/17    Provider (CBE) [facility]    Initial Mammogram 08/09/19    Last Mammogram Annual    Last Mammogram Date 09/08/17    Provider (Mammogram)  Delford Field    Recent Breast Symptoms Pain   Long standing tenderness with palpation on medial aspect of right breast. No changes to tenderness. Pt is not bothered by this.     Breast Cancer History   Breast Cancer History No personal or family history      Previous History of Breast Problems   Breast Surgery or Biopsy None    Breast Implants N/A    BSE Done Never      Gynecological/Obstetrical History   Is there any chance that the client could be pregnant?  No    Age at menarche 77    Age at menopause 40    PAP smear history Annually    Date of last PAP  11/17/13    Provider (PAP) [facility]    Age at first live birth 21    Breast fed children No    DES Exposure Unkown    Cervical, Uterine or Ovarian cancer No    Family history of Cervial, Uterine or Ovarian cancer No    Hysterectomy No    Cervix removed No    Ovaries removed No    Laser/Cryosurgery No    Current method of birth control None    Current method of Estrogen/Hormone replacement None    Smoking history None    Comments [facility] eligibility: No ins / 4 in [facility] / 50k/yr            Review of Systems     Objective:   Physical Exam Chest:     Breasts:        Right: No swelling, bleeding, inverted nipple, mass, nipple discharge, skin change or tenderness.        Left: No swelling, bleeding, inverted nipple, mass, nipple discharge, skin change or tenderness.    Abdominal:     Palpations: There is no hepatomegaly or splenomegaly.  Genitourinary:    Exam position: Lithotomy position.     Pubic Area: No rash or pubic lice.      Labia:        Right:  No rash, tenderness, lesion or injury.        Left: No rash, tenderness, lesion or injury.      Urethra: No prolapse, urethral pain, urethral swelling or urethral lesion.     Vagina: No signs of injury and foreign body. No vaginal discharge, erythema, tenderness, bleeding, lesions or prolapsed vaginal walls.     Cervix: No cervical motion tenderness, discharge, friability, lesion or erythema.     Uterus: Not deviated.      Adnexa:        Right: No mass.         Left: No mass.       Rectum: No mass.     Lymphadenopathy:     Upper Body:     Right upper body: No supraclavicular or axillary adenopathy.     Left upper body: No supraclavicular adenopathy.        Assessment:     59  year old Caucasian female returns to [facility] for annual screening.  Clinical breast exam with tender fibroglandular like tissue.  Taught self breast awareness.  Specimen collected for pap smear without difficulty.  Patient has been screened for eligibility.  She does not have any insurance, Medicare or Medicaid.  She also meets financial eligibility.   Risk Assessment    Risk Scores      08/09/2019 09/08/2017   Last edited by: Alta Corning, CMA Lenn Sink, RN   5-year risk: 1.8 % 1.7 %   Lifetime risk: 10.7 % 10.9 %            Plan:     Screening mammogram ordered.  Specimen for pap sent to the lab.  Will follow up per [facility] protocol.

## 2019-08-09 NOTE — Patient Instructions (Signed)
Gave patient hand-out, Women Staying Healthy, Active and Well from [facility], with education on breast health, pap smears, heart and colon health. 

## 2019-08-15 LAB — IGP, APTIMA HPV: HPV Aptima: NEGATIVE

## 2019-08-18 ENCOUNTER — Encounter: Payer: Self-pay | Admitting: *Deleted

## 2019-08-18 NOTE — Progress Notes (Signed)
Letter mailed to inform patient of her normal mammogram and pap smear results.  Next mammo due in 1 year and pap smear due in 5 years.

## 2020-09-03 ENCOUNTER — Ambulatory Visit
Admission: RE | Admit: 2020-09-03 | Discharge: 2020-09-03 | Disposition: A | Discharge status: Home / Self Care | Payer: Self-pay | Source: Ambulatory Visit | Attending: Oncology | Admitting: Oncology

## 2020-09-03 ENCOUNTER — Other Ambulatory Visit: Payer: Self-pay

## 2020-09-03 ENCOUNTER — Ambulatory Visit: Payer: Self-pay | Attending: Oncology

## 2020-09-03 VITALS — BP 137/83 | HR 71 | Temp 96.4°F | Ht 65.5 in | Wt 194.7 lb

## 2020-09-03 DIAGNOSIS — Z0000 Encounter for general adult medical examination without abnormal findings: Secondary | ICD-10-CM

## 2020-09-03 NOTE — Progress Notes (Signed)
  Subjective:     Patient ID: Anna Gallagher, female   DOB: 1960-07-10, 60 y.o.   MRN: [id number]  HPI   Review of Systems     Objective:   Physical Exam Chest:  Breasts:    Right: No swelling, bleeding, inverted nipple, mass, nipple discharge, skin change or tenderness.     Left: No swelling, bleeding, inverted nipple, mass, nipple discharge, skin change or tenderness.       Assessment:     60 year old patient presents for [facility] clinic visit .  Patient screened, and meets [facility] eligibility.  Patient does not have insurance, Medicare or Medicaid.  Instructed patient on breast self awareness using teach back method.   Clinical breast exam unremarkable. No mass or lump. Risk Assessment     Risk Scores       09/03/2020 08/09/2019   Last edited by: Jim Like, RN [place], Freada Bergeron, CMA   5-year risk: 1.9 % 1.8 %   Lifetime risk: 10.2 % 10.7 %               Plan:     Sent for bilateral screening mammogram.

## 2020-09-26 NOTE — Progress Notes (Signed)
Letter mailed from [facility] to notify of normal mammogram results.  Patient to return in one year for annual screening.  Copy to HSIS. 

## 2021-10-27 ENCOUNTER — Other Ambulatory Visit: Payer: Self-pay

## 2021-10-27 DIAGNOSIS — Z1231 Encounter for screening mammogram for malignant neoplasm of breast: Secondary | ICD-10-CM

## 2021-10-28 ENCOUNTER — Ambulatory Visit
Admission: RE | Admit: 2021-10-28 | Discharge: 2021-10-28 | Disposition: A | Discharge status: Home / Self Care | Payer: Self-pay | Source: Ambulatory Visit | Attending: Obstetrics and Gynecology | Admitting: Obstetrics and Gynecology

## 2021-10-28 ENCOUNTER — Ambulatory Visit: Payer: Self-pay | Attending: Hematology and Oncology | Admitting: Hematology and Oncology

## 2021-10-28 VITALS — BP 133/80 | Wt 202.7 lb

## 2021-10-28 DIAGNOSIS — Z1231 Encounter for screening mammogram for malignant neoplasm of breast: Secondary | ICD-10-CM

## 2021-10-28 NOTE — Progress Notes (Signed)
Ms. TYSHEENA GINZBURG is a 61 y.o. female who presents to [facility] clinic today with no complaints .    Pap Smear: Pap not smear completed today. Last Pap smear was 08/09/2019 at [facility] and was normal. Per patient has no history of an abnormal Pap smear. Last Pap smear result is not available in Epic.   Physical exam: Breasts Breasts symmetrical. No skin abnormalities bilateral breasts. No nipple retraction bilateral breasts. No nipple discharge bilateral breasts. No lymphadenopathy. No lumps palpated bilateral breasts.       Pelvic/Bimanual Pap is not indicated today    Smoking History: Patient has is a former smoker having quit in 1987 and was not referred to quit line.    Patient Navigation: Patient education provided. Access to services provided for patient through [facility] program. No interpreter provided. No transportation provided   Colorectal Cancer Screening: Per patient  2013 colonoscopy benign. Will follow up with PCP for repeat.  No complaints today.    Breast and Cervical Cancer Risk Assessment: Patient does not have family history of breast cancer, known genetic mutations, or radiation treatment to the chest before age 69. Patient does not have history of cervical dysplasia, immunocompromised, or DES exposure in-utero.  Risk Assessment   No risk assessment data for the current encounter  Risk Scores       09/03/2020   Last edited by: Rico Junker, RN   5-year risk: 1.9 %   Lifetime risk: 10.2 %            A: [facility] exam without pap smear No complaints, benign exam.   P: [facility]. Appointment scheduled 10/28/2021.  Melodye Ped, NP 10/28/2021 3:04 PM

## 2022-04-28 IMAGING — MG MM DIGITAL SCREENING BILAT W/ TOMO AND CAD
8 series · 8 of 24 positions shown · non-contrast
Comparison: Previous exam(s).

ACR Breast Density Category a: The breast tissue is almost entirely
fatty.

CLINICAL DATA: Screening.

EXAM:
DIGITAL SCREENING BILATERAL MAMMOGRAM WITH TOMOSYNTHESIS AND CAD
TECHNIQUE: Bilateral screening digital craniocaudal and mediolateral oblique
mammograms were obtained. Bilateral screening digital breast
tomosynthesis was performed. The images were evaluated with
computer-aided detection.

[L CC synth-2D]
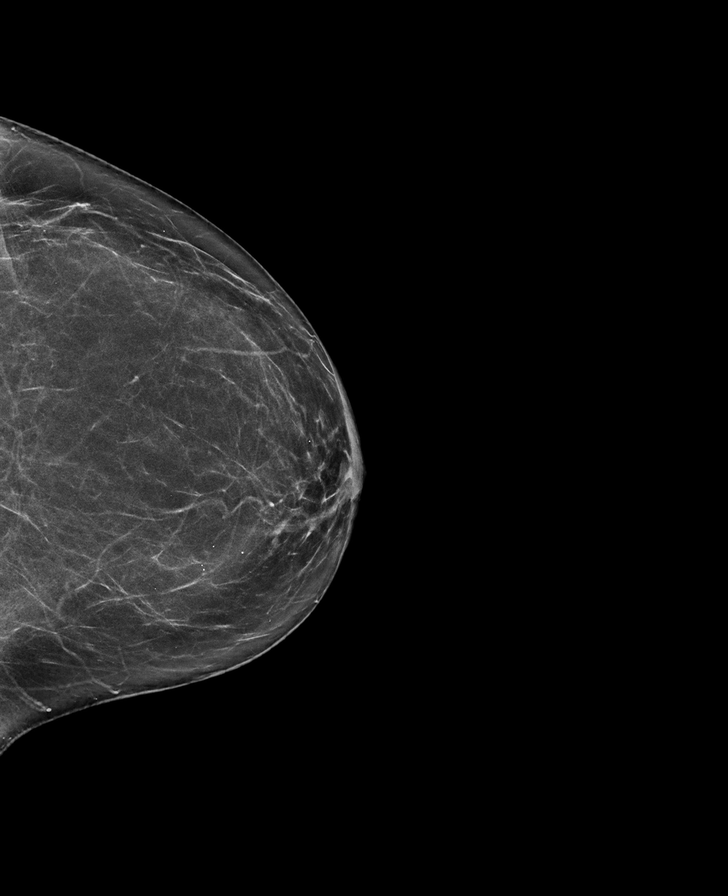

[R MLO synth-2D]
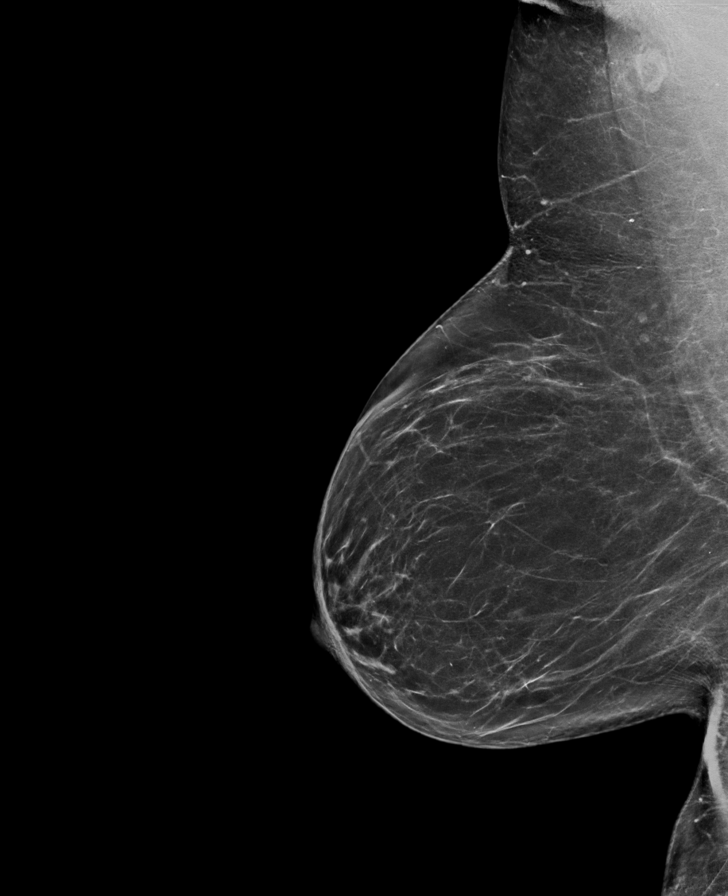

[L MLO synth-2D]
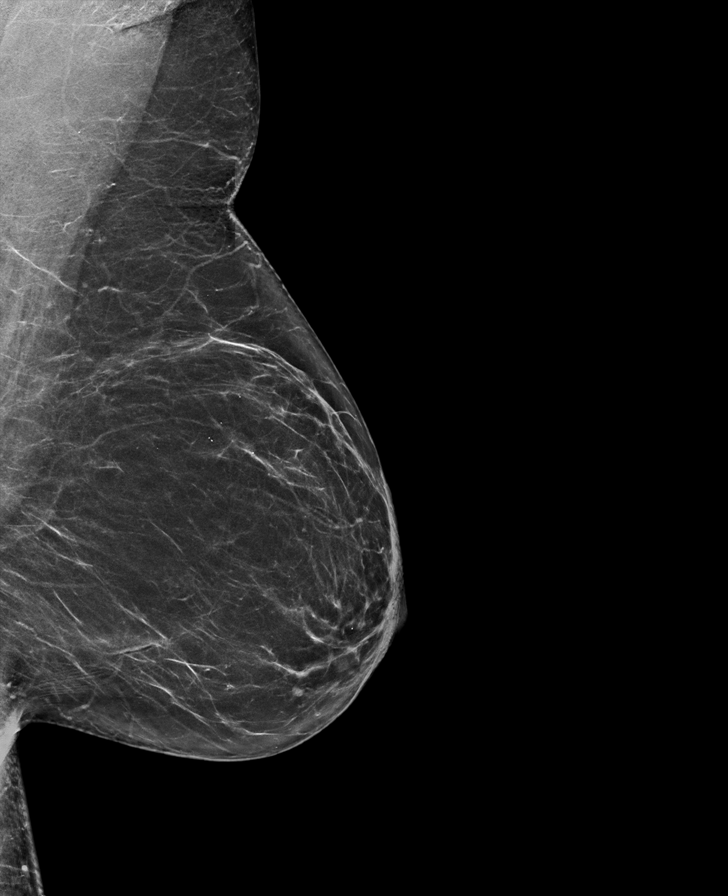

[R CC synth-2D]
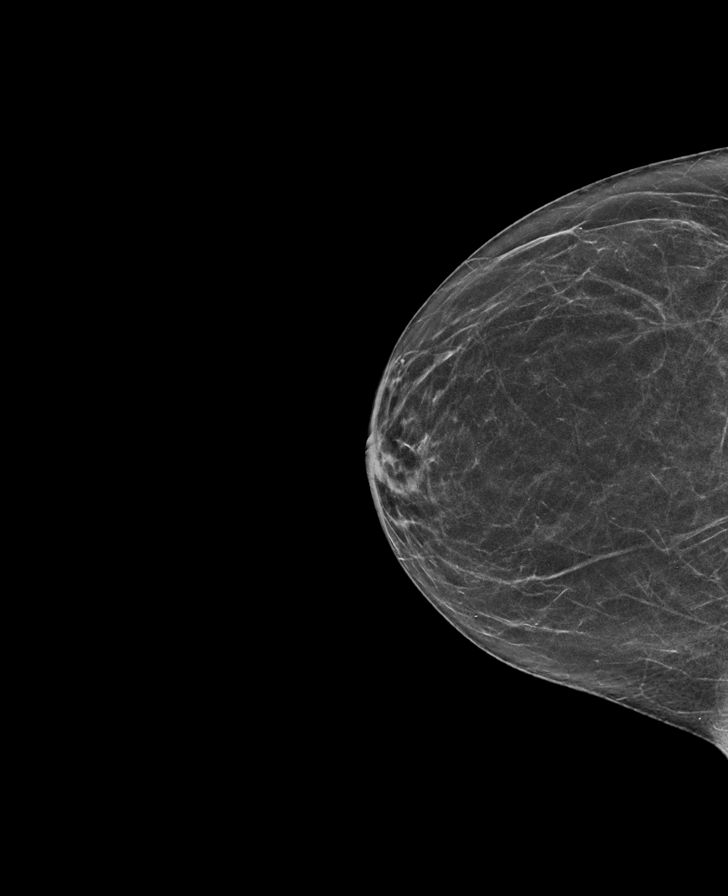

[L MLO tomo · tomo slice 35/69.0]
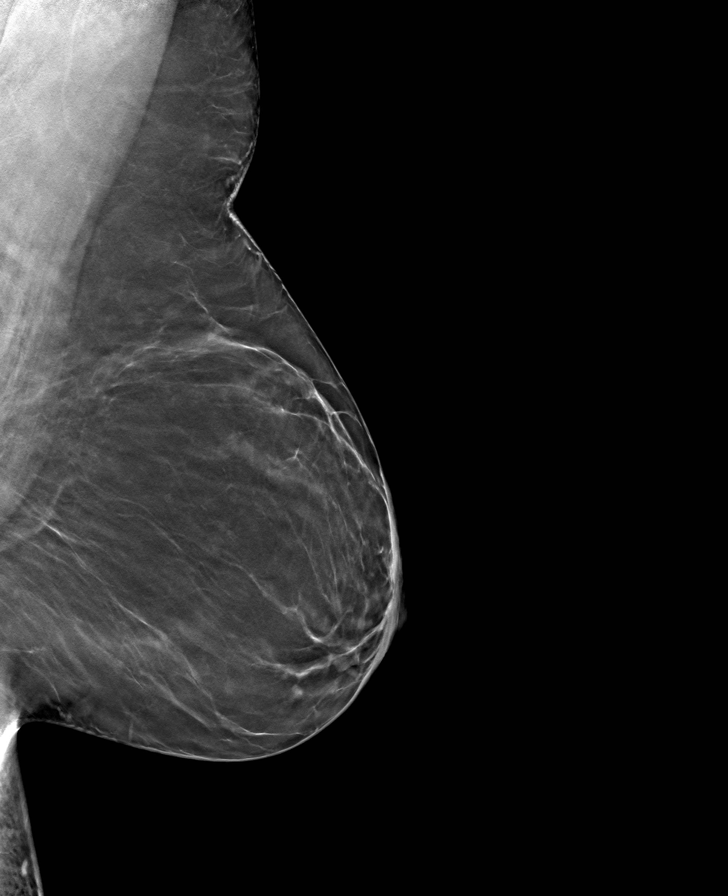

[L CC tomo · tomo slice 33/66.0]
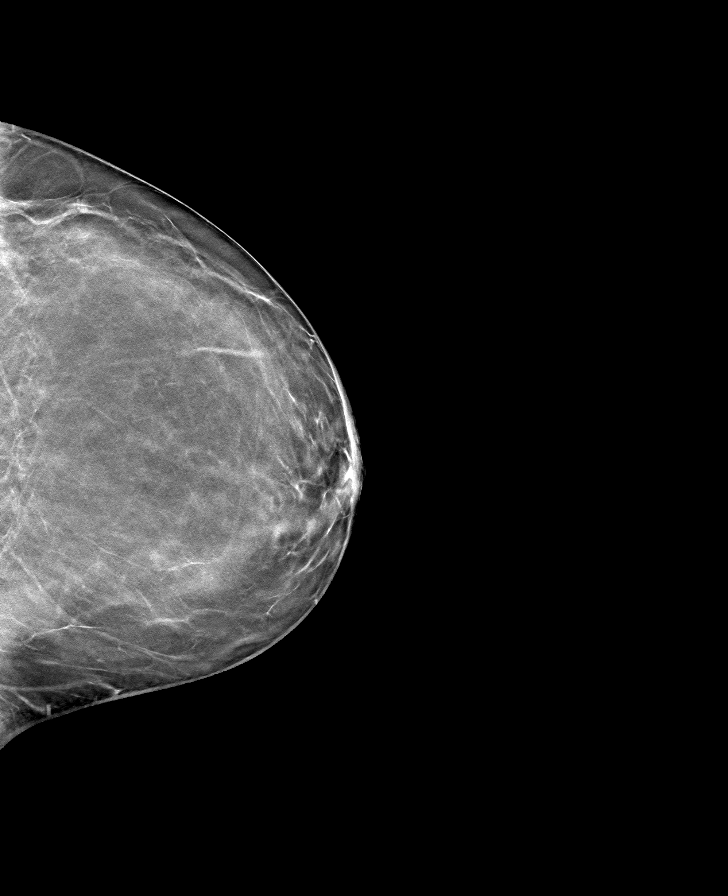

[R MLO tomo · tomo slice 35/70.0]
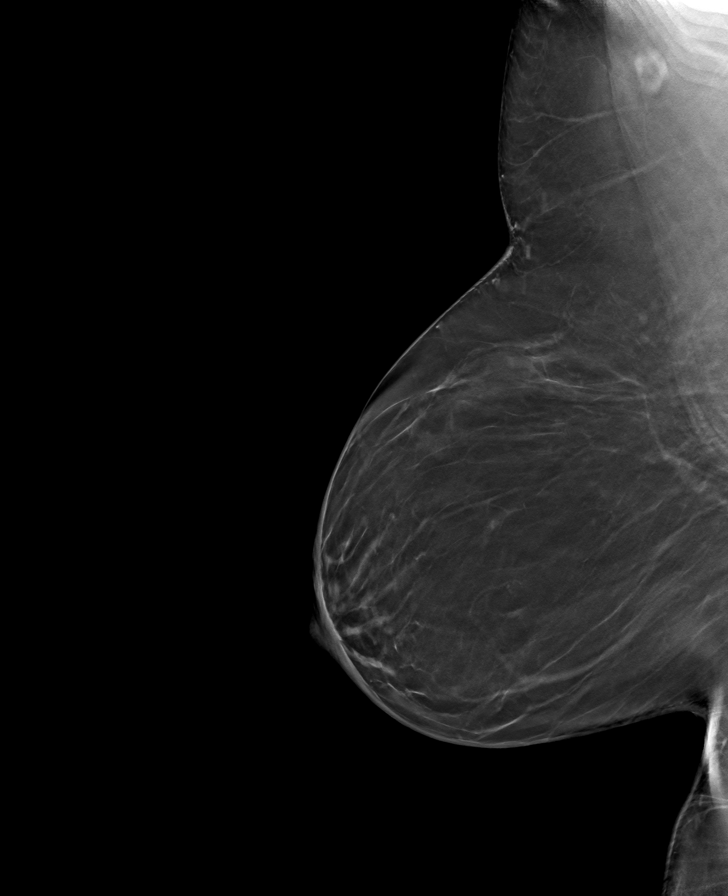

[R CC tomo · tomo slice 28/55.0]
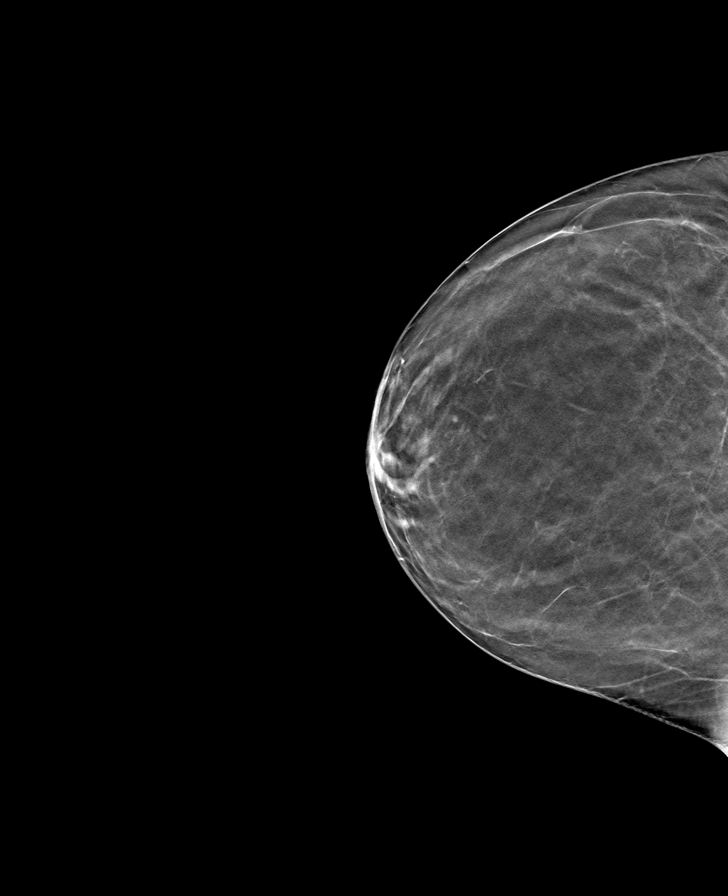

[8 of 24 positions shown; findings below may reference images not displayed]

FINDINGS: There are no findings suspicious for malignancy.
IMPRESSION: No mammographic evidence of malignancy. A result letter of this
screening mammogram will be mailed directly to the patient.

RECOMMENDATION:
Screening mammogram in one year. (Code:0E-3-N98)

BI-RADS CATEGORY  1: Negative.

## 2022-09-07 DIAGNOSIS — L304 Erythema intertrigo: Secondary | ICD-10-CM | POA: Diagnosis not present

## 2022-09-07 DIAGNOSIS — L218 Other seborrheic dermatitis: Secondary | ICD-10-CM | POA: Diagnosis not present

## 2022-09-07 DIAGNOSIS — D225 Melanocytic nevi of trunk: Secondary | ICD-10-CM | POA: Diagnosis not present

## 2022-09-07 DIAGNOSIS — L814 Other melanin hyperpigmentation: Secondary | ICD-10-CM | POA: Diagnosis not present

## 2022-09-07 DIAGNOSIS — L738 Other specified follicular disorders: Secondary | ICD-10-CM | POA: Diagnosis not present

## 2022-09-07 DIAGNOSIS — L821 Other seborrheic keratosis: Secondary | ICD-10-CM | POA: Diagnosis not present

## 2022-11-05 ENCOUNTER — Ambulatory Visit: Payer: Self-pay

## 2022-11-05 NOTE — Telephone Encounter (Signed)
Chief Complaint: Larey Seat after after feeling dizzy for a moment Symptoms: above Frequency: 6 months  - but this is first fall Pertinent Negatives: Patient denies other s/s Disposition: [] ED /[x] Urgent Care (no appt availability in office) / [] Appointment(In office/virtual)/ []  [facility]/ [] Home Care/ [] Refused Recommended Disposition /[] [facility]/ []  Follow-up with PCP Additional Notes: Yesterday pt was running around with young children, felt like she was going to fall and then did.  She states that although this is her first fall, she has had a few instances of this in the past few months. Pt has changed her diet recently and has lost weight. Advised UC, however pt states she most likely not go unless this occurs again. Pt has NP appt set at [facility] and is on wait list as well.    Patient also stated she is not in any pain or dizzy at the moment.   ----- Message from Lennox Pippins sent at 11/05/2022  4:18 PM EDT -----  Patient had a splint put on 4 of her teeth at the [facility].   ----- Message from Lennox Pippins sent at 11/05/2022  4:17 PM EDT -----  Patient called on the [facility] line stating she was a former patient of Dr Henriette Combs.  Patient has established care elsewhere and not happy with PCP.   Patient states she took a fall last night during a dizzy spell, hurt 4 of her teeth.  Patient states she is a patient at [facility] and went to the UC there.  Patient wanted to establish care at [facility]    Patient has a NP visit scheduled for 12/08/22 with [facility] and has been added to the wait list but wants to discuss her falling and her dizzy spell.   Patients callback # [phone] Reason for Disposition  [1] MODERATE dizziness (e.g., interferes with normal activities) AND [2] has NOT been evaluated by doctor (or NP/PA) for this  (Exception: Dizziness caused by heat exposure, sudden standing, or poor fluid intake.)  Answer Assessment - Initial Assessment  Questions 1. DESCRIPTION: "Describe your dizziness."     Pt had a weird feeling and fell 2. LIGHTHEADED: "Do you feel lightheaded?" (e.g., somewhat faint, woozy, weak upon standing)     no 3. VERTIGO: "Do you feel like either you or the room is spinning or tilting?" (i.e. vertigo)     no 4. SEVERITY: "How bad is it?"  "Do you feel like you are going to faint?" "Can you stand and walk?"   - MILD: Feels slightly dizzy, but walking normally.   - MODERATE: Feels unsteady when walking, but not falling; interferes with normal activities (e.g., school, work).   - SEVERE: Unable to walk without falling, or requires assistance to walk without falling; feels like passing out now.      Off balance 5. ONSET:  "When did the dizziness begin?"     A couple times going down stairs. 6. AGGRAVATING FACTORS: "Does anything make it worse?" (e.g., standing, change in head position)     8. CAUSE: "What do you think is causing the dizziness?"     Low blood sugar or low BP 9. RECURRENT SYMPTOM: "Have you had dizziness before?" If Yes, ask: "When was the last time?" "What happened that time?"     yes 10. OTHER SYMPTOMS: "Do you have any other symptoms?" (e.g., fever, chest pain, vomiting, diarrhea, bleeding)       no  Protocols used: Dizziness -  Lightheadedness-A-AH

## 2022-12-08 ENCOUNTER — Ambulatory Visit: Payer: 59 | Admitting: Pediatrics

## 2022-12-30 ENCOUNTER — Other Ambulatory Visit: Payer: Self-pay | Admitting: Family Medicine

## 2022-12-30 DIAGNOSIS — Z1231 Encounter for screening mammogram for malignant neoplasm of breast: Secondary | ICD-10-CM

## 2023-02-09 ENCOUNTER — Ambulatory Visit
Admission: RE | Admit: 2023-02-09 | Discharge: 2023-02-09 | Disposition: A | Discharge status: Home / Self Care | Payer: 59 | Source: Ambulatory Visit | Attending: Family Medicine | Admitting: Family Medicine

## 2023-02-09 DIAGNOSIS — Z1231 Encounter for screening mammogram for malignant neoplasm of breast: Secondary | ICD-10-CM | POA: Diagnosis present
